# Patient Record
Sex: Female | Born: 2000 | Race: White | Hispanic: No | Smoking: Never smoker
Health system: Southern US, Community
[De-identification: ages and names within clinical notes are randomized; demographics above are authoritative.]

## PROBLEM LIST (undated history)

## (undated) DIAGNOSIS — R112 Nausea with vomiting, unspecified: Secondary | ICD-10-CM

## (undated) DIAGNOSIS — O020 Blighted ovum and nonhydatidiform mole: Secondary | ICD-10-CM

## (undated) DIAGNOSIS — Z9889 Other specified postprocedural states: Secondary | ICD-10-CM

## (undated) HISTORY — PX: TYMPANOSTOMY TUBE PLACEMENT: SHX32

---
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
## 2020-01-02 DIAGNOSIS — Z419 Encounter for procedure for purposes other than remedying health state, unspecified: Secondary | ICD-10-CM | POA: Diagnosis not present

## 2020-02-01 DIAGNOSIS — Z419 Encounter for procedure for purposes other than remedying health state, unspecified: Secondary | ICD-10-CM | POA: Diagnosis not present

## 2020-02-02 DIAGNOSIS — Z20822 Contact with and (suspected) exposure to covid-19: Secondary | ICD-10-CM | POA: Diagnosis not present

## 2020-03-03 DIAGNOSIS — Z419 Encounter for procedure for purposes other than remedying health state, unspecified: Secondary | ICD-10-CM | POA: Diagnosis not present

## 2020-04-03 DIAGNOSIS — Z419 Encounter for procedure for purposes other than remedying health state, unspecified: Secondary | ICD-10-CM | POA: Diagnosis not present

## 2020-05-01 DIAGNOSIS — Z419 Encounter for procedure for purposes other than remedying health state, unspecified: Secondary | ICD-10-CM | POA: Diagnosis not present

## 2020-06-01 DIAGNOSIS — Z419 Encounter for procedure for purposes other than remedying health state, unspecified: Secondary | ICD-10-CM | POA: Diagnosis not present

## 2020-06-30 DIAGNOSIS — K529 Noninfective gastroenteritis and colitis, unspecified: Secondary | ICD-10-CM | POA: Diagnosis not present

## 2020-07-01 DIAGNOSIS — Z419 Encounter for procedure for purposes other than remedying health state, unspecified: Secondary | ICD-10-CM | POA: Diagnosis not present

## 2020-07-02 ENCOUNTER — Telehealth: Payer: Self-pay

## 2020-07-02 NOTE — Telephone Encounter (Signed)
Transition Care Management Follow-up Telephone Call  Date of discharge and from where: 07/01/2020 from [facility]  How have you been since you were released from the hospital? Pt stated that she is feeling much better today and has no questions or concerns.   Any questions or concerns? No  Items Reviewed:  Did the pt receive and understand the discharge instructions provided? Yes   Medications obtained and verified? Yes   Other? No   Any new allergies since your discharge? No   Dietary orders reviewed? n/a  Do you have support at home? Yes   Functional Questionnaire: (I = Independent and D = Dependent) ADLs: I  Bathing/Dressing- I  Meal Prep- I  Eating- I  Maintaining continence- I  Transferring/Ambulation- I  Managing Meds- I   Follow up appointments reviewed:   PCP Hospital f/u appt confirmed? No    Specialist Hospital f/u appt confirmed? No    Are transportation arrangements needed? No   If their condition worsens, is the pt aware to call PCP or go to the Emergency Dept.? Yes  Was the patient provided with contact information for the PCP's office or ED? Yes  Was to pt encouraged to call back with questions or concerns? Yes

## 2020-08-01 DIAGNOSIS — Z419 Encounter for procedure for purposes other than remedying health state, unspecified: Secondary | ICD-10-CM | POA: Diagnosis not present

## 2020-08-31 DIAGNOSIS — Z419 Encounter for procedure for purposes other than remedying health state, unspecified: Secondary | ICD-10-CM | POA: Diagnosis not present

## 2020-11-28 IMAGING — US OBSTETRIC <14 WK US AND TRANSVAGINAL OB US
1 series · 14 of 28 positions shown · non-contrast
Comparison: None.

CLINICAL DATA: Pregnant, beta HCG 5167, previous molar pregnancy

EXAM:
OBSTETRIC <14 WK US AND TRANSVAGINAL OB US
TECHNIQUE: Both transabdominal and transvaginal ultrasound examinations were
performed for complete evaluation of the gestation as well as the
maternal uterus, adnexal regions, and pelvic cul-de-sac.
Transvaginal technique was performed to assess early pregnancy.

[Series 1: obstetric <14 wk us and transvaginal ob us · 155 acquisitions, 14 frames shown]
[im 6/155]
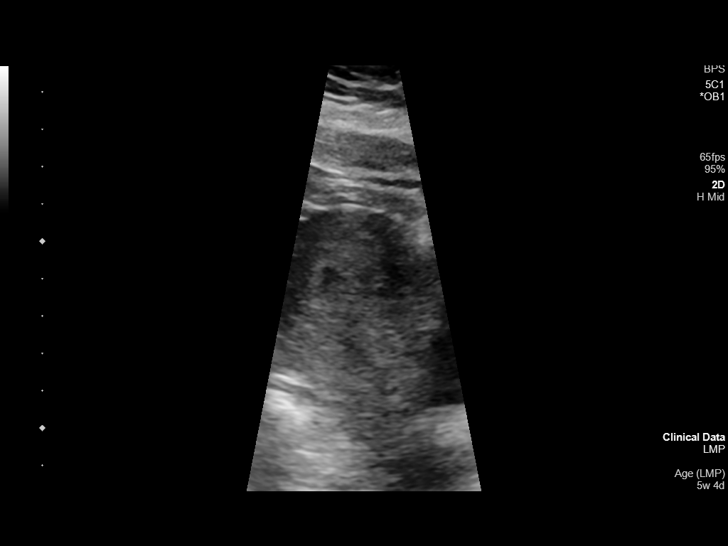
[im 18/155]
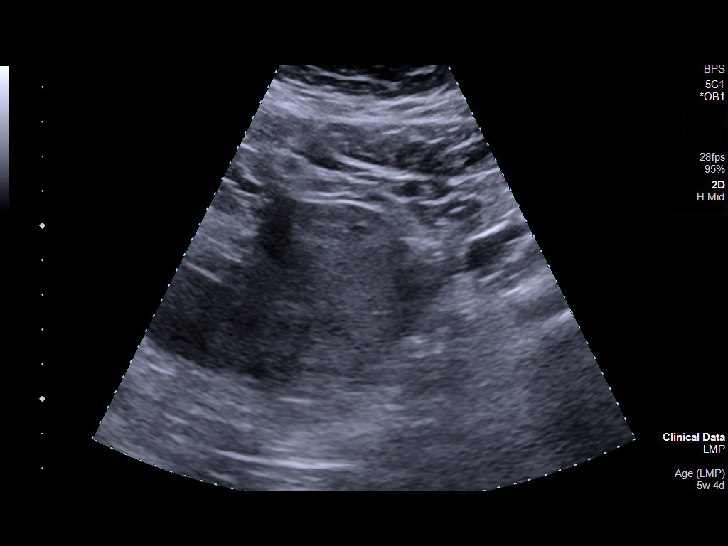
[im 29/155]
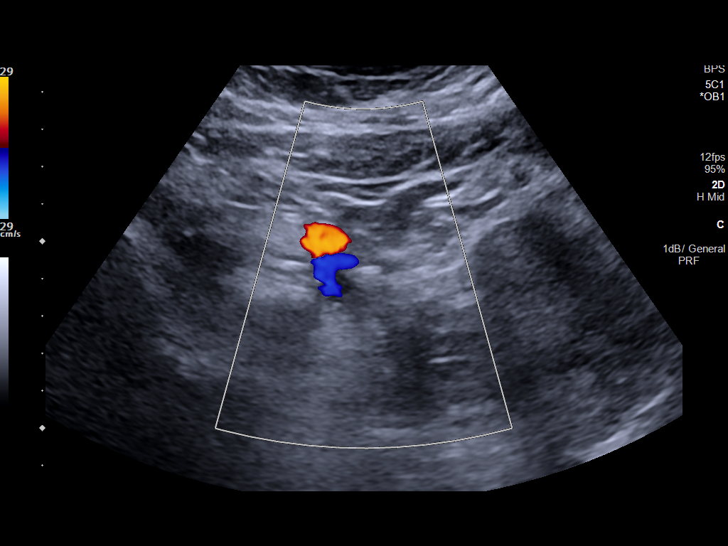
[im 40/155]
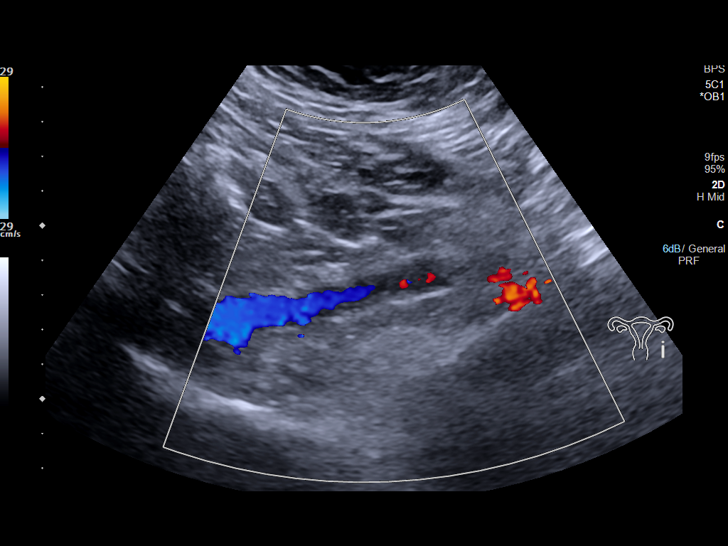
[im 52/155]
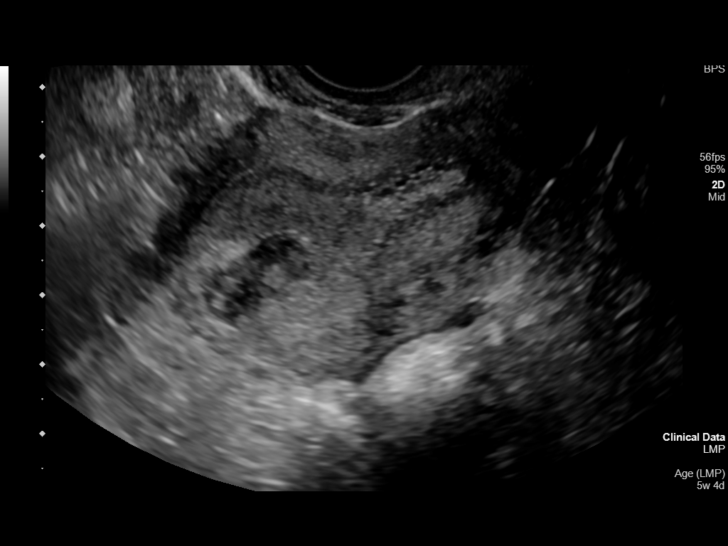
[im 63/155]
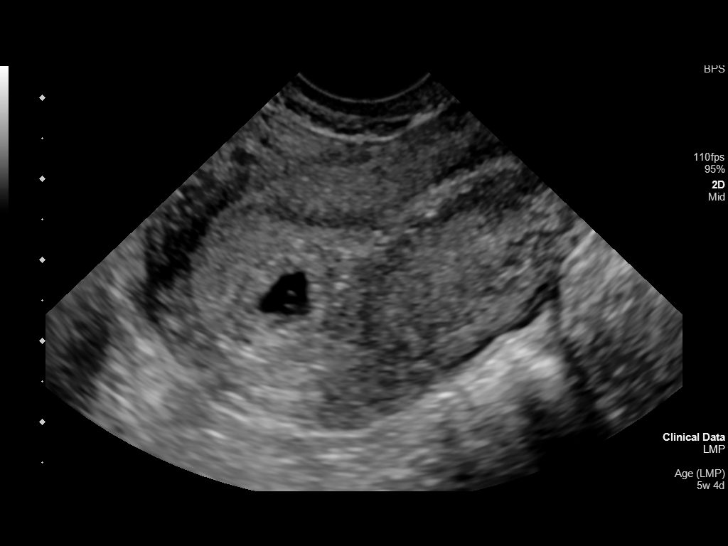
[im 75/155]
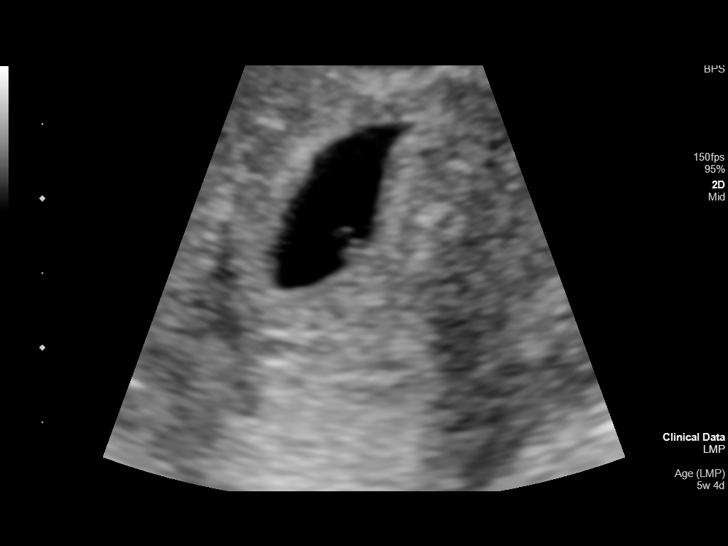
[im 86/155]
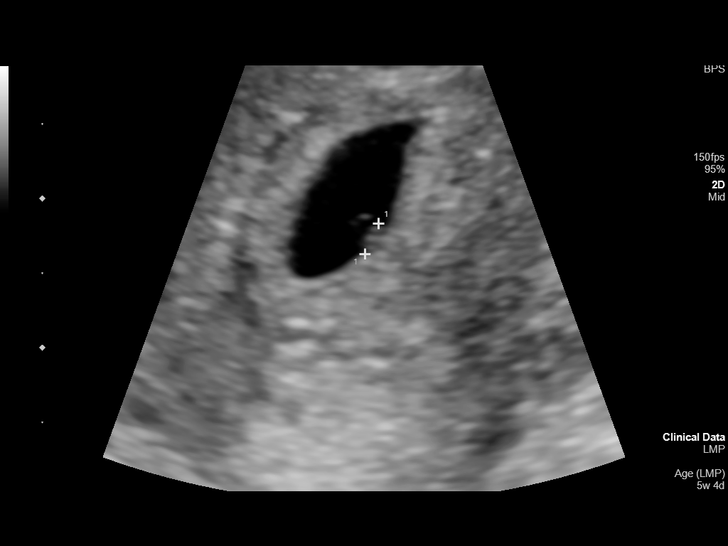
[im 97/155]
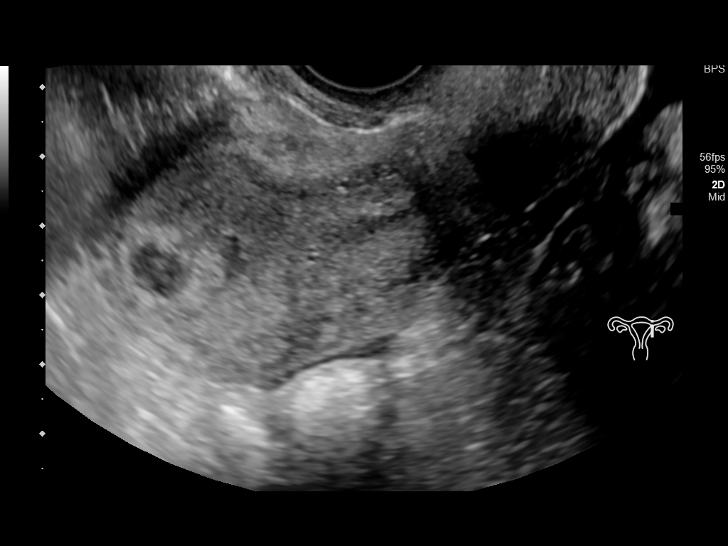
[im 109/155]
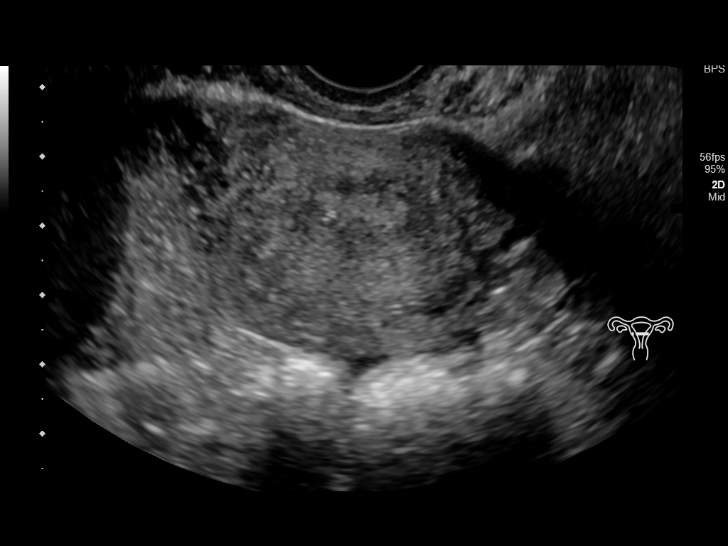
[im 120/155]
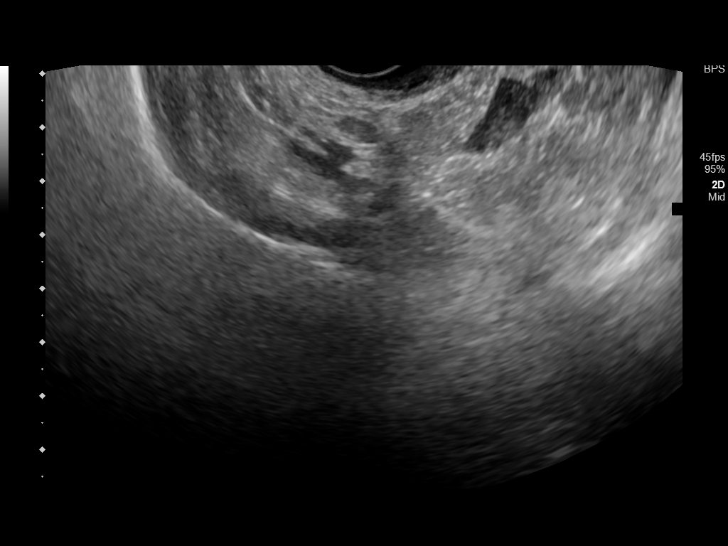
[im 132/155]
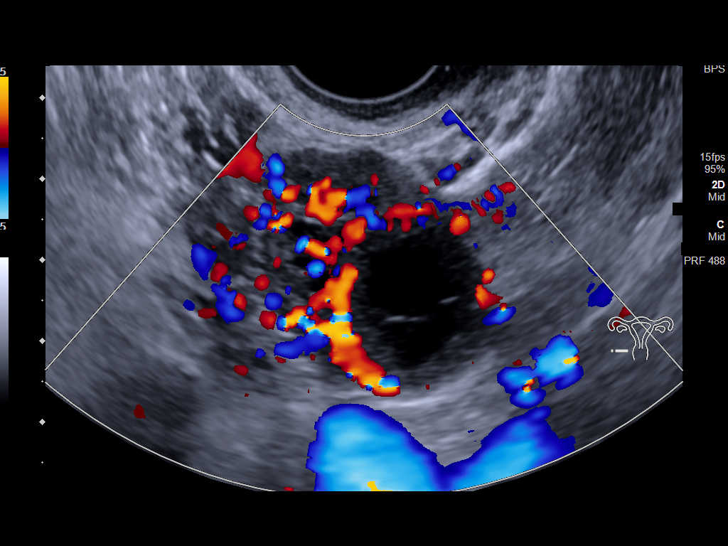
[im 143/155]
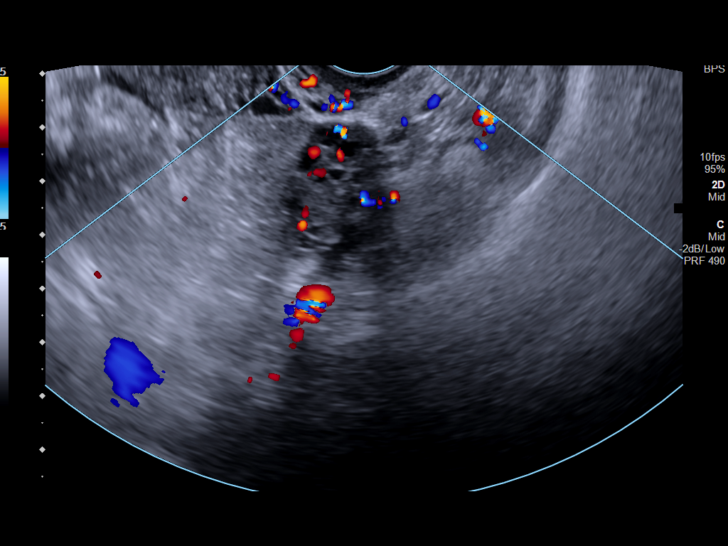
[im 155/155]
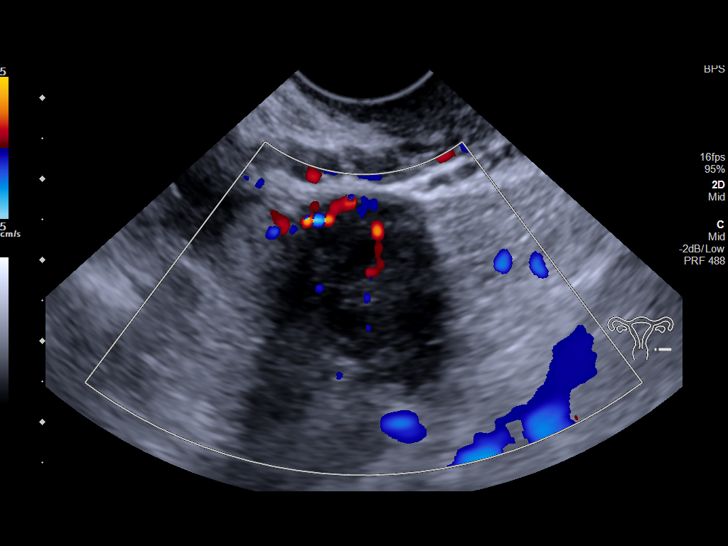

[14 of 28 positions shown; findings below may reference images not displayed]

FINDINGS: Intrauterine gestational sac: Single

Yolk sac:  Visualized.

Embryo:  Visualized.

Cardiac Activity: Not Visualized.

CRL:  2.2 mm   5 w   5 d                  US EDC: 04/01/2019

Subchorionic hemorrhage: Small subchorionic hemorrhage.

Maternal uterus/adnexae: Bilateral ovaries are within normal limits,
noting a right corpus luteal cyst.

Small pelvic ascites.
IMPRESSION: Early intrauterine gestational sac, measuring 5 weeks 5 days by
crown-rump length. No cardiac activity is demonstrated, although
possibly related to early gestational age.

Serial beta HCG is suggested. Follow-up pelvic ultrasound is
suggested in 7-10 days to confirm viability.
